# Patient Record
Sex: Female | Born: 1960 | Race: White | Hispanic: No | Marital: Single | Smoking: Current some day smoker
Health system: Southern US, Community
[De-identification: ages and names within clinical notes are randomized; demographics above are authoritative.]

## PROBLEM LIST (undated history)

## (undated) DIAGNOSIS — S069XAA Unspecified intracranial injury with loss of consciousness status unknown, initial encounter: Secondary | ICD-10-CM

## (undated) DIAGNOSIS — S069X9A Unspecified intracranial injury with loss of consciousness of unspecified duration, initial encounter: Secondary | ICD-10-CM

---

## 2009-09-05 ENCOUNTER — Emergency Department: Payer: Self-pay | Admitting: Emergency Medicine

## 2009-09-16 ENCOUNTER — Emergency Department (HOSPITAL_COMMUNITY): Admission: EM | Admit: 2009-09-16 | Discharge: 2009-09-16 | Payer: Self-pay | Admitting: Emergency Medicine

## 2010-10-24 ENCOUNTER — Emergency Department: Payer: Self-pay | Admitting: Emergency Medicine

## 2010-11-30 LAB — GLUCOSE, CAPILLARY
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
## 2016-06-04 ENCOUNTER — Encounter: Payer: Self-pay | Admitting: *Deleted

## 2016-06-04 ENCOUNTER — Ambulatory Visit
Admission: EM | Admit: 2016-06-04 | Discharge: 2016-06-04 | Disposition: A | Discharge status: Home / Self Care | Payer: Self-pay | Attending: Emergency Medicine | Admitting: Emergency Medicine

## 2016-06-04 DIAGNOSIS — K0889 Other specified disorders of teeth and supporting structures: Secondary | ICD-10-CM

## 2016-06-04 DIAGNOSIS — K047 Periapical abscess without sinus: Secondary | ICD-10-CM

## 2016-06-04 HISTORY — DX: Unspecified intracranial injury with loss of consciousness status unknown, initial encounter: S06.9XAA

## 2016-06-04 HISTORY — DX: Unspecified intracranial injury with loss of consciousness of unspecified duration, initial encounter: S06.9X9A

## 2016-06-04 MED ORDER — TRAMADOL HCL 50 MG PO TABS: ORAL_TABLET | ORAL | 0 refills | Status: AC

## 2016-06-04 MED ORDER — IBUPROFEN 800 MG PO TABS: 800.0000 mg | ORAL_TABLET | Freq: Three times a day (TID) | ORAL | 0 refills | Status: AC

## 2016-06-04 MED ORDER — AMOXICILLIN-POT CLAVULANATE 875-125 MG PO TABS: 1.0000 | ORAL_TABLET | Freq: Two times a day (BID) | ORAL | 0 refills | Status: AC

## 2016-06-04 NOTE — Discharge Instructions (Signed)
800 mg of ibuprofen with 1 g of Tylenol 3 times a day this is an effective combination for pain. Follow-up with [facility] or with your dentist as soon as possible. Go  to the ER for the signs and symptoms we discussed

## 2016-06-04 NOTE — ED Provider Notes (Signed)
HPI  SUBJECTIVE:  Robyn Robinson is a 55 y.o. female who presents with 4 days of intermittent, throbbing, pounding right upper tooth pain. States it became constant today. States that she has pain shooting into her eye and jaw. She is not sure if she has any gum swelling. She was taking Tylenol 1000 mg ibuprofen 200 mg with some improvement in her symptoms, but has not responded to this and Aleve today. She has also tried applying pressure and using Anbesol. There are no alleviating factors. Symptoms are worse with exposure to air, eating, temperature changes. She states she feels feverish but has no documented fevers. She reports foul-smelling breath, but no facial swelling. She does not recall any trauma to the tooth. No nasal congestion, sinus pain and pressure. Has taken an antipyretic was within 4 hours of evaluation. She has a past medical history of traumatic brain injury, no history of diabetes, hypertension. LMP: 2010. PMD: [facility]. Dentist: [facility].   Past Medical History:  Diagnosis Date  . Traumatic brain injury [facility](HCC)     Past Surgical History:  Procedure Laterality Date  . CESAREAN SECTION      History reviewed. No pertinent family history.  Social History  Substance Use Topics  . Smoking status: Current Some Day Smoker  . Smokeless tobacco: Never Used  . Alcohol use No    No current facility-administered medications for this encounter.   Current Outpatient Prescriptions:  .  amoxicillin-clavulanate (AUGMENTIN) 875-125 MG tablet, Take 1 tablet by mouth 2 (two) times daily. X 7 days, Disp: 14 tablet, Rfl: 0 .  ibuprofen (ADVIL,MOTRIN) 800 MG tablet, Take 1 tablet (800 mg total) by mouth 3 (three) times daily., Disp: 30 tablet, Rfl: 0 .  traMADol (ULTRAM) 50 MG tablet, 1-2 tabs po q 6 hr prn pain Maximum dose= 8 tablets per day, Disp: 20 tablet, Rfl: 0  Not on File   ROS  As noted in HPI.   Physical Exam  BP (!) 160/76 (BP Location: Left  Arm)   Pulse 60   Temp 98.4 F (36.9 C) (Oral)   Resp 16   Ht 5\' 7"  (1.702 m)   Wt 142 lb (64.4 kg)   SpO2 99%   BMI 22.24 kg/m   Constitutional: Well developed, well nourished, no acute distress Eyes:  EOMI, conjunctiva normal bilaterally HENT: Normocephalic, atraumatic,mucus membranes moist. Positive tenderness to the tooth 5 first bicuspid. Positive gingival swelling. Tooth appears will seated. No expressible purulent drainage. No trismus.  Neck: No cervical lymphadenopathy. Respiratory: Normal inspiratory effort Cardiovascular: Normal rate GI: nondistended skin: No rash, skin intact Musculoskeletal: no deformities Neurologic: Alert & oriented x 3, no focal neuro deficits Psychiatric: Speech and behavior appropriate   ED Course   Medications - No data to display  No orders of the defined types were placed in this encounter.   No results found for this or any previous visit (from the past 24 hour(s)). No results found.  ED Clinical Impression  Toothache  Dental abscess   ED Assessment/Plan  [place] narcotic database reviewed. No opiate prescriptions in the past 6 months.  Patient requesting something "not as strong" as Vicodin as she states that this makes her vomit. We'll try tramadol. Plan send him with Augmentin, 800 mg ibuprofen with 1 g of Tylenol 3 times a day.  Performed dental block with relief in symptoms.  She has a dentist that she will follow up with. Also provided referral to [facility].  Patient states that she did have an episode of chest "heaviness" with some weakness and radiation down both arms and "not feeling well" 4 days ago. States that she went to bed, and then woke up with his toothache. It is not happened since. Discussed with her that this is extremely concerning for cardiac etiology, and offered to talk about this, however, she states that she is symptom-free, wanted to address the tooth here tonight only. Given of the  tooth is tender to palpation and responded to the dental block, think that she has a dental infection and not a toothache as an anginal equivalent. Advised her to call EMS immediately if this happens again. She states that she'll follow-up with her primary care physician about this tomorrow.  Discussed MDM, plan and followup with patient. Discussed sn/sx that should prompt return to the ED. Patient agrees with plan.   Meds ordered this encounter  Medications  . ibuprofen (ADVIL,MOTRIN) 800 MG tablet    Sig: Take 1 tablet (800 mg total) by mouth 3 (three) times daily.    Dispense:  30 tablet    Refill:  0  . amoxicillin-clavulanate (AUGMENTIN) 875-125 MG tablet    Sig: Take 1 tablet by mouth 2 (two) times daily. X 7 days    Dispense:  14 tablet    Refill:  0  . traMADol (ULTRAM) 50 MG tablet    Sig: 1-2 tabs po q 6 hr prn pain Maximum dose= 8 tablets per day    Dispense:  20 tablet    Refill:  0    *This clinic note was created using Scientist, clinical (histocompatibility and immunogenetics). Therefore, there may be occasional mistakes despite careful proofreading.  ?    Domenick Gong, MD 06/04/16 2209

## 2016-06-04 NOTE — ED Triage Notes (Signed)
States last Sunday her arms suddenly went numb and she became weak, laid down on her sofa and awoke 6 hours later with a toothache. Here now with 10/10 dental pain, right upper which radiates over face.

## 2019-12-11 ENCOUNTER — Ambulatory Visit: Payer: Self-pay | Attending: Internal Medicine

## 2019-12-11 ENCOUNTER — Other Ambulatory Visit: Payer: Self-pay

## 2019-12-11 DIAGNOSIS — Z23 Encounter for immunization: Secondary | ICD-10-CM

## 2019-12-11 NOTE — Progress Notes (Signed)
   Covid-19 Vaccination Clinic  Name:  Silas Delima    MRN: [id number] DOB: May 06, 1961  12/11/2019  Ms. Bober was observed post Covid-19 immunization for 15 minutes without incident. She was provided with Vaccine Information Sheet and instruction to access the V-Safe system.   Ms. Kosar was instructed to call 911 with any severe reactions post vaccine: Marland Kitchen Difficulty breathing  . Swelling of face and throat  . A fast heartbeat  . A bad rash all over body  . Dizziness and weakness   Immunizations Administered    Name Date Dose VIS Date Route   Pfizer COVID-19 Vaccine 12/11/2019  2:09 PM 0.3 mL 08/25/2019 Intramuscular   Manufacturer: [employer], [employer]   Lot: [id number]   NDC: [address]-0

## 2020-01-09 ENCOUNTER — Ambulatory Visit: Payer: Self-pay | Attending: Internal Medicine

## 2020-01-09 DIAGNOSIS — Z23 Encounter for immunization: Secondary | ICD-10-CM

## 2020-01-09 NOTE — Progress Notes (Signed)
   Covid-19 Vaccination Clinic  Name:  Robyn Robinson    MRN: [id number] DOB: 01/04/1961  01/09/2020  Ms. [name] was observed post Covid-19 immunization for 15 minutes without incident. She was provided with Vaccine Information Sheet and instruction to access the V-Safe system.   Ms. [name] was instructed to call 911 with any severe reactions post vaccine: Marland Kitchen Difficulty breathing  . Swelling of face and throat  . A fast heartbeat  . A bad rash all over body  . Dizziness and weakness   Immunizations Administered    Name Date Dose VIS Date Route   Pfizer COVID-19 Vaccine 01/09/2020 12:53 PM 0.3 mL 11/08/2018 Intramuscular   Manufacturer: [employer], [employer]   Lot: [id number]   NDC: [address]-8
